# Patient Record
Sex: Female | Born: 1995 | Race: White | Hispanic: No | Marital: Single | Smoking: Never smoker
Health system: Southern US, Community
[De-identification: ages and names within clinical notes are randomized; demographics above are authoritative.]

## PROBLEM LIST (undated history)

## (undated) DIAGNOSIS — W3400XA Accidental discharge from unspecified firearms or gun, initial encounter: Secondary | ICD-10-CM

## (undated) DIAGNOSIS — Y249XXA Unspecified firearm discharge, undetermined intent, initial encounter: Secondary | ICD-10-CM

---

## 2016-05-16 HISTORY — PX: PELVIC FRACTURE SURGERY: SHX119

## 2016-07-25 ENCOUNTER — Emergency Department: Payer: No Typology Code available for payment source

## 2016-07-25 ENCOUNTER — Emergency Department
Admission: EM | Admit: 2016-07-25 | Discharge: 2016-07-25 | Disposition: A | Discharge status: Home / Self Care | Payer: No Typology Code available for payment source | Attending: Emergency Medicine | Admitting: Emergency Medicine

## 2016-07-25 ENCOUNTER — Encounter: Payer: Self-pay | Admitting: Emergency Medicine

## 2016-07-25 DIAGNOSIS — S32301A Unspecified fracture of right ilium, initial encounter for closed fracture: Secondary | ICD-10-CM

## 2016-07-25 DIAGNOSIS — Y999 Unspecified external cause status: Secondary | ICD-10-CM | POA: Insufficient documentation

## 2016-07-25 DIAGNOSIS — R1030 Lower abdominal pain, unspecified: Secondary | ICD-10-CM | POA: Diagnosis not present

## 2016-07-25 DIAGNOSIS — Y939 Activity, unspecified: Secondary | ICD-10-CM | POA: Insufficient documentation

## 2016-07-25 DIAGNOSIS — S3993XA Unspecified injury of pelvis, initial encounter: Secondary | ICD-10-CM | POA: Diagnosis present

## 2016-07-25 DIAGNOSIS — Y92410 Unspecified street and highway as the place of occurrence of the external cause: Secondary | ICD-10-CM | POA: Insufficient documentation

## 2016-07-25 DIAGNOSIS — S32391A Other fracture of right ilium, initial encounter for closed fracture: Secondary | ICD-10-CM | POA: Diagnosis not present

## 2016-07-25 HISTORY — DX: Unspecified firearm discharge, undetermined intent, initial encounter: Y24.9XXA

## 2016-07-25 HISTORY — DX: Accidental discharge from unspecified firearms or gun, initial encounter: W34.00XA

## 2016-07-25 LAB — CBC
HCT: 37.2 % (ref 35.0–47.0)
HEMOGLOBIN: 12.3 g/dL (ref 12.0–16.0)
MCH: 30.3 pg (ref 26.0–34.0)
MCHC: 33.1 g/dL (ref 32.0–36.0)
MCV: 91.5 fL (ref 80.0–100.0)
Platelets: 136 10*3/uL — ABNORMAL LOW (ref 150–440)
RBC: 4.07 MIL/uL (ref 3.80–5.20)
RDW: 14.5 % (ref 11.5–14.5)
WBC: 7.5 10*3/uL (ref 3.6–11.0)

## 2016-07-25 LAB — COMPREHENSIVE METABOLIC PANEL
ALBUMIN: 3.7 g/dL (ref 3.5–5.0)
ALK PHOS: 49 U/L (ref 38–126)
ALT: 12 U/L — AB (ref 14–54)
AST: 18 U/L (ref 15–41)
Anion gap: 5 (ref 5–15)
BUN: 16 mg/dL (ref 6–20)
CALCIUM: 8.4 mg/dL — AB (ref 8.9–10.3)
CO2: 23 mmol/L (ref 22–32)
CREATININE: 0.77 mg/dL (ref 0.44–1.00)
Chloride: 112 mmol/L — ABNORMAL HIGH (ref 101–111)
GFR calc Af Amer: >60 mL/min (ref >60)
GFR calc non Af Amer: >60 mL/min (ref >60)
GLUCOSE: 88 mg/dL (ref 65–99)
Potassium: 3.5 mmol/L (ref 3.5–5.1)
Sodium: 140 mmol/L (ref 135–145)
Total Bilirubin: 0.1 mg/dL — ABNORMAL LOW (ref 0.3–1.2)
Total Protein: 6.4 g/dL — ABNORMAL LOW (ref 6.5–8.1)

## 2016-07-25 LAB — URINALYSIS, ROUTINE W REFLEX MICROSCOPIC
BILIRUBIN URINE: NEGATIVE
GLUCOSE, UA: NEGATIVE mg/dL
HGB URINE DIPSTICK: NEGATIVE
KETONES UR: NEGATIVE mg/dL
LEUKOCYTES UA: NEGATIVE
Nitrite: NEGATIVE
PH: 7 (ref 5.0–8.0)
Protein, ur: NEGATIVE mg/dL
Specific Gravity, Urine: 1.015 (ref 1.005–1.030)

## 2016-07-25 LAB — POCT PREGNANCY, URINE: Preg Test, Ur: NEGATIVE

## 2016-07-25 MED ORDER — IOPAMIDOL (ISOVUE-300) INJECTION 61%
100.0000 mL | Freq: Once | INTRAVENOUS | Status: AC | PRN
  Administered 2016-07-25: 100 mL via INTRAVENOUS

## 2016-07-25 MED ORDER — ONDANSETRON HCL 4 MG/2ML IJ SOLN
4.0000 mg | Freq: Once | INTRAMUSCULAR | Status: AC
  Administered 2016-07-25: 4 mg via INTRAVENOUS
  Filled 2016-07-25: qty 2

## 2016-07-25 MED ORDER — FENTANYL CITRATE (PF) 100 MCG/2ML IJ SOLN
50.0000 ug | Freq: Once | INTRAMUSCULAR | Status: AC
  Administered 2016-07-25: 50 ug via INTRAVENOUS
  Filled 2016-07-25: qty 2

## 2016-07-25 MED ORDER — SODIUM CHLORIDE 0.9 % IV BOLUS (SEPSIS)
1000.0000 mL | Freq: Once | INTRAVENOUS | Status: AC
  Administered 2016-07-25: 1000 mL via INTRAVENOUS

## 2016-07-25 MED ORDER — HYDROCODONE-ACETAMINOPHEN 5-325 MG PO TABS: 1.0000 | ORAL_TABLET | ORAL | 0 refills | Status: AC | PRN

## 2016-07-25 MED ORDER — IOPAMIDOL (ISOVUE-300) INJECTION 61%
30.0000 mL | Freq: Once | INTRAVENOUS | Status: AC | PRN
  Administered 2016-07-25: 30 mL via ORAL

## 2016-07-25 NOTE — ED Notes (Signed)
Pt reports abdominal pain after drinking oral contrast; completed 1 1/2 bottles.  MD aware and ok to scan.  CT notified.
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
## 2016-07-25 NOTE — ED Notes (Signed)
Patient transported to X-ray 

## 2016-07-25 NOTE — Discharge Instructions (Signed)
Please take your pain medication as needed, as prescribed. Please follow-up with orthopedics for evaluation. Return to the department for any acute worsening pain.

## 2016-07-25 NOTE — ED Notes (Signed)
Patient transported to CT 
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
